# Patient Record
Sex: Male | Born: 2000 | Race: White | Hispanic: No | Marital: Single | Smoking: Current every day smoker
Health system: Southern US, Community
[De-identification: ages and names within clinical notes are randomized; demographics above are authoritative.]

---
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
## 2021-04-04 ENCOUNTER — Emergency Department (INDEPENDENT_AMBULATORY_CARE_PROVIDER_SITE_OTHER)
Admission: EM | Admit: 2021-04-04 | Discharge: 2021-04-04 | Disposition: A | Discharge status: Home / Self Care | Payer: No Typology Code available for payment source | Source: Home / Self Care | Attending: Family Medicine | Admitting: Family Medicine

## 2021-04-04 ENCOUNTER — Other Ambulatory Visit: Payer: Self-pay

## 2021-04-04 ENCOUNTER — Encounter: Payer: Self-pay | Admitting: Emergency Medicine

## 2021-04-04 ENCOUNTER — Emergency Department (INDEPENDENT_AMBULATORY_CARE_PROVIDER_SITE_OTHER): Payer: No Typology Code available for payment source

## 2021-04-04 DIAGNOSIS — S62347A Nondisplaced fracture of base of fifth metacarpal bone. left hand, initial encounter for closed fracture: Secondary | ICD-10-CM | POA: Diagnosis not present

## 2021-04-04 DIAGNOSIS — M25442 Effusion, left hand: Secondary | ICD-10-CM | POA: Diagnosis not present

## 2021-04-04 DIAGNOSIS — M79642 Pain in left hand: Secondary | ICD-10-CM

## 2021-04-04 NOTE — Discharge Instructions (Addendum)
Wear ace wrap to minimize swelling.  Elevate hand has much as possible.  May take ibuprofen or Tylenol as needed.

## 2021-04-04 NOTE — ED Triage Notes (Signed)
Left hand injury hit with a baseball last night, Painful, swollen.

## 2021-04-06 NOTE — ED Provider Notes (Signed)
[facility]    CSN: [id number] Arrival date & time: 04/04/21  0807      History   Chief Complaint Chief Complaint  Patient presents with  . Hand Injury    HPI Thomas Baxter is a 20 y.o. male.   Patient's left hand was hit on its ulnar aspect with a baseball last night.  He had immediate pain/swelling that has persisted.  The history is provided by the patient.  Hand Injury Location:  Hand Hand location:  L hand Injury: yes   Time since incident:  1 day Mechanism of injury comment:  Struck by baseball Pain details:    Quality:  Aching   Radiates to:  Does not radiate   Severity:  Moderate   Onset quality:  Sudden   Duration:  1 day   Timing:  Constant   Progression:  Unchanged Handedness:  Left-handed Prior injury to area:  No Relieved by:  Nothing Worsened by:  Movement Ineffective treatments:  Ice Associated symptoms: decreased range of motion, stiffness and swelling   Associated symptoms: no muscle weakness, no numbness and no tingling     History reviewed. No pertinent past medical history.  There are no problems to display for this patient.   History reviewed. No pertinent surgical history.     Home Medications    Prior to Admission medications   Medication Sig Start Date End Date Taking? Authorizing Provider  ibuprofen (ADVIL) 200 MG tablet Take 200 mg by mouth every 6 (six) hours as needed.   Yes [provider]    Family History Family History  Problem Relation Age of Onset  . Healthy Mother   . Healthy Father     Social History Social History   Tobacco Use  . Smoking status: Never Smoker  . Smokeless tobacco: Never Used  Vaping Use  . Vaping Use: Never used  Substance Use Topics  . Alcohol use: No     Allergies   Patient has no known allergies.   Review of Systems Review of Systems  Musculoskeletal: Positive for joint swelling and stiffness.  Skin: Negative for color change and wound.  All other  systems reviewed and are negative.    Physical Exam Triage Vital Signs ED Triage Vitals  Enc Vitals Group     BP 04/04/21 0834 128/72     Pulse Rate 04/04/21 0834 74     Resp 04/04/21 0834 18     Temp 04/04/21 0834 98.7 F (37.1 C)     Temp Source 04/04/21 0834 Oral     SpO2 04/04/21 0834 99 %     Weight 04/04/21 0835 150 lb (68 kg)     Height 04/04/21 0835 5\' 10"  (1.778 m)     Head Circumference --      Peak Flow --      Pain Score 04/04/21 0835 8     Pain Loc --      Pain Edu? --      Excl. in GC? --    No data found.  Updated Vital Signs BP 128/72 (BP Location: Right Arm)   Pulse 74   Temp 98.7 F (37.1 C) (Oral)   Resp 18   Ht 5\' 10"  (1.778 m)   Wt 68 kg   SpO2 99%   BMI 21.52 kg/m   Visual Acuity Right Eye Distance:   Left Eye Distance:   Bilateral Distance:    Right Eye Near:   Left Eye Near:  Bilateral Near:     Physical Exam Vitals and nursing note reviewed.  Constitutional:      General: He is not in acute distress. HENT:     Head: Atraumatic.  Cardiovascular:     Rate and Rhythm: Normal rate.  Pulmonary:     Effort: Pulmonary effort is normal.  Musculoskeletal:       Hands:     Comments: Patient's left hand has swelling and tenderness to palpation over the fifth MCP joint.  Fifth finger has decreased range of motion; distal neurovascular function is intact.   Skin:    General: Skin is warm and dry.  Neurological:     Mental Status: He is alert.      UC Treatments / Results  Labs (all labs ordered are listed, but only abnormal results are displayed) Labs Reviewed - No data to display  EKG   Radiology No results found.  Procedures Procedures (including critical care time)  Medications Ordered in UC Medications - No data to display  Initial Impression / Assessment and Plan / UC Course  I have reviewed the triage vital signs and the nursing notes.  Pertinent labs & imaging results that were available during my care of  the patient were reviewed by me and considered in my medical decision making (see chart for details).    Patient's fracture will require orthopedic evaluation/treatment.  He declines referral to orthopedist. Ace wrap applied to left hand. Recommend he follow-up with orthopedist as soon as possible.   Final Clinical Impressions(s) / UC Diagnoses   Final diagnoses:  Closed nondisplaced fracture of base of fifth metacarpal bone of left hand, initial encounter     Discharge Instructions     Wear ace wrap to minimize swelling.  Elevate hand has much as possible.  May take ibuprofen or Tylenol as needed.   ED Prescriptions    None        Lattie Haw, MD 04/06/21 1300

## 2021-08-26 IMAGING — DX DG FOOT COMPLETE 3+V*R*
3 series · 3 of 3 positions shown · non-contrast
Comparison: None.

CLINICAL DATA: Pain and swelling following rolling type injury

EXAM:
RIGHT FOOT COMPLETE - 3+ VIEW

[foot ap]
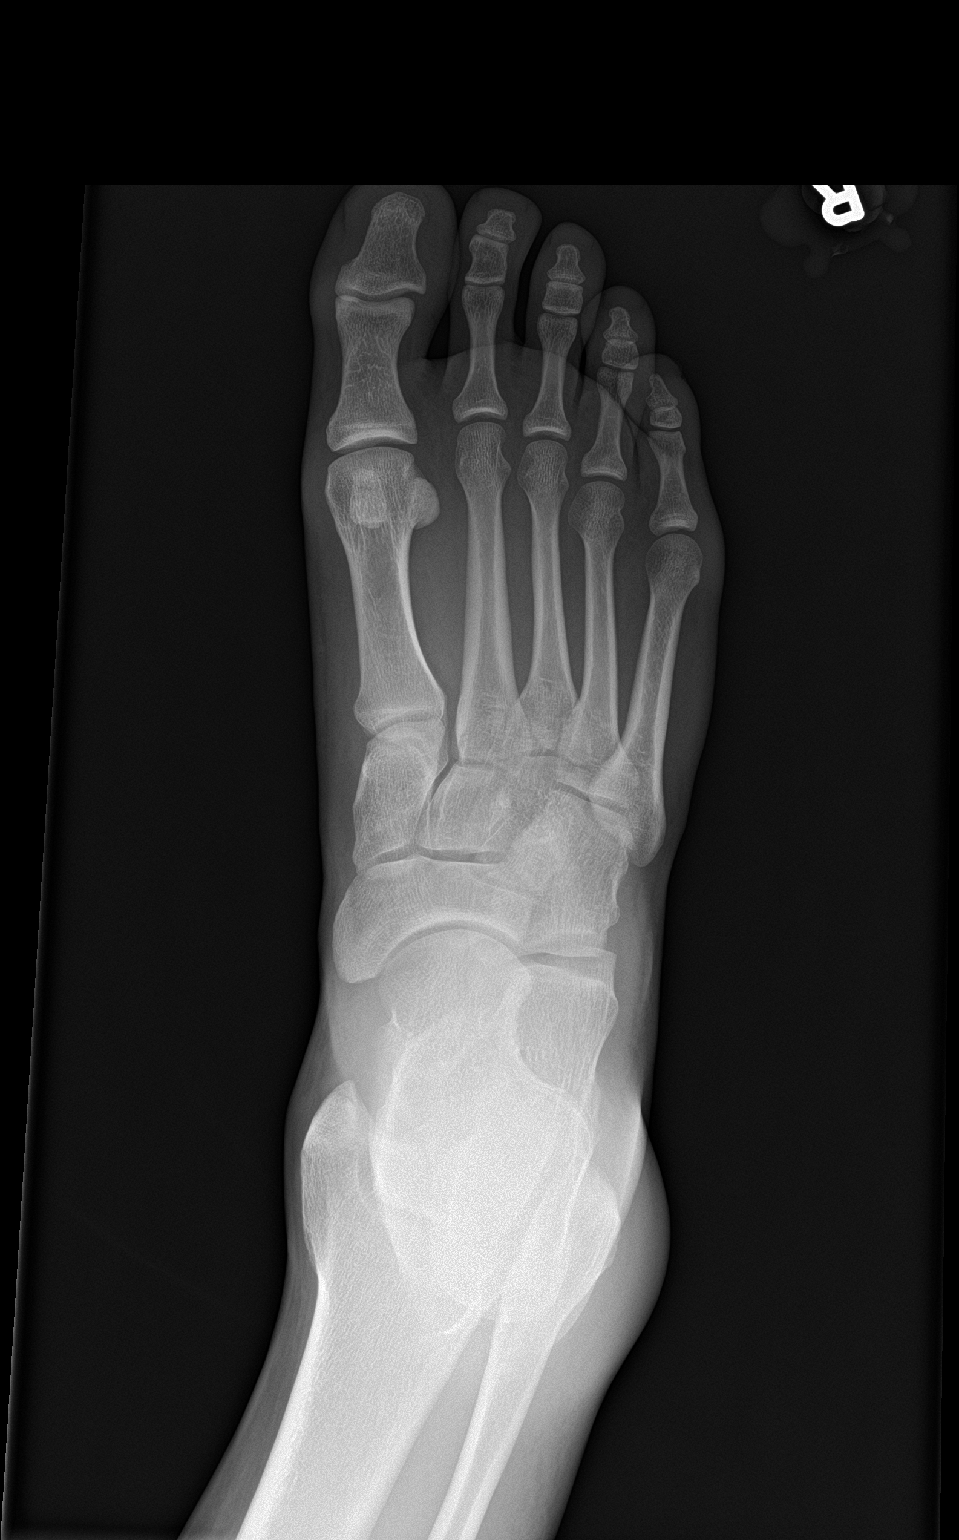

[foot obl]
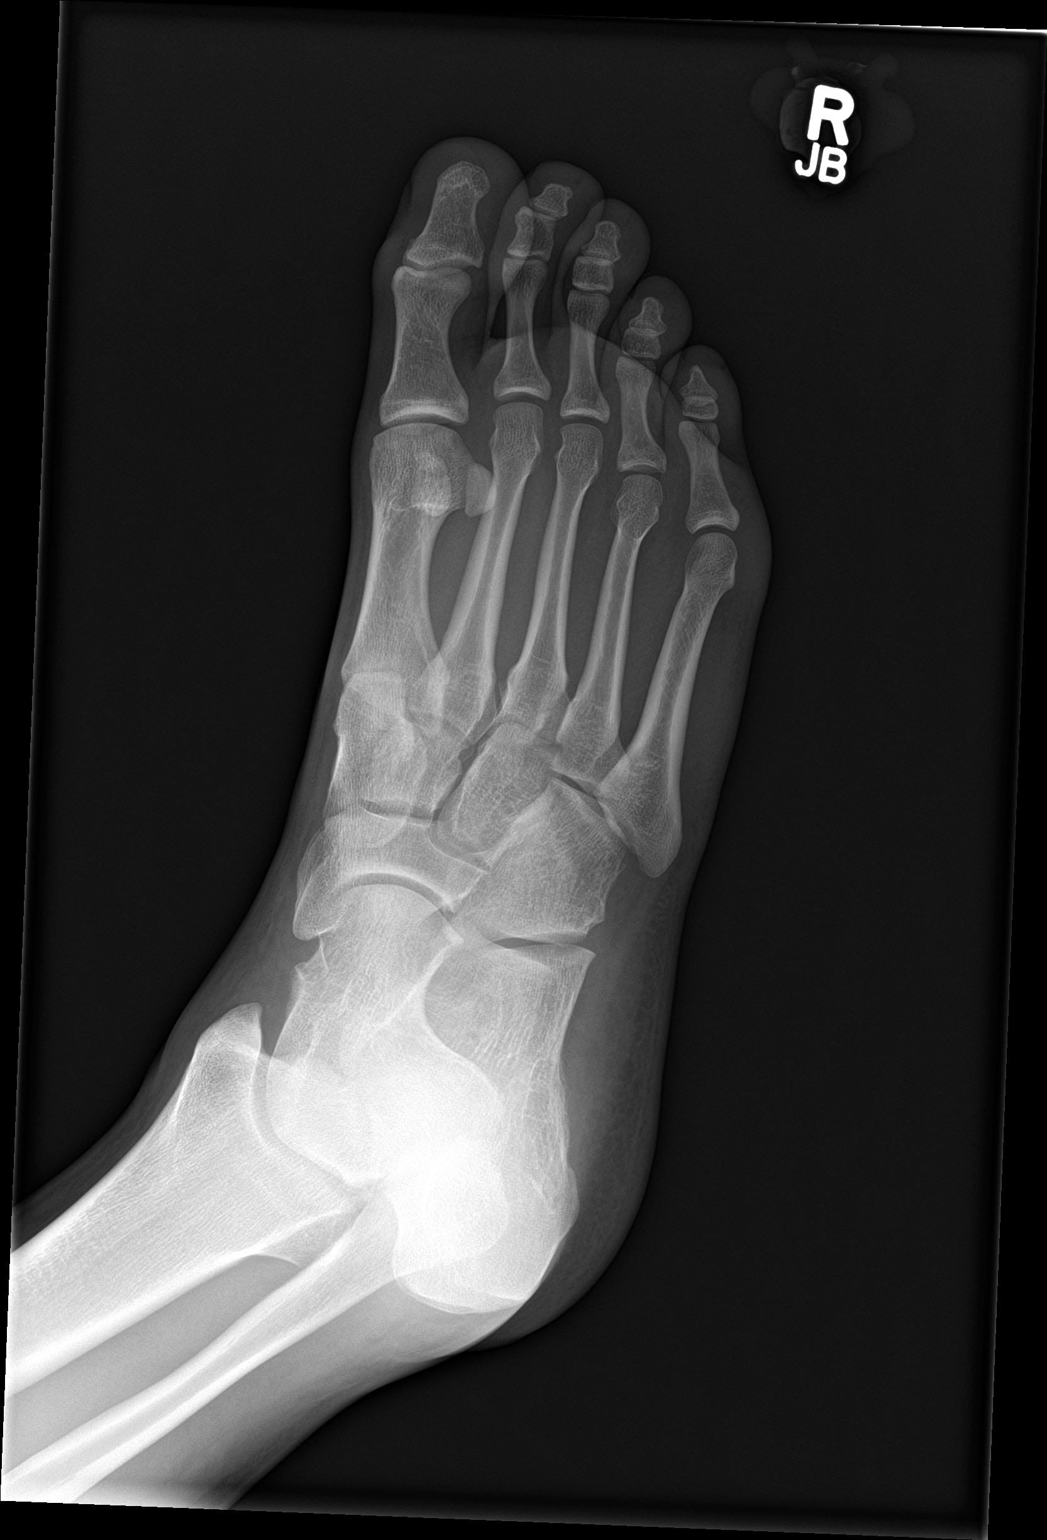

[foot lat]
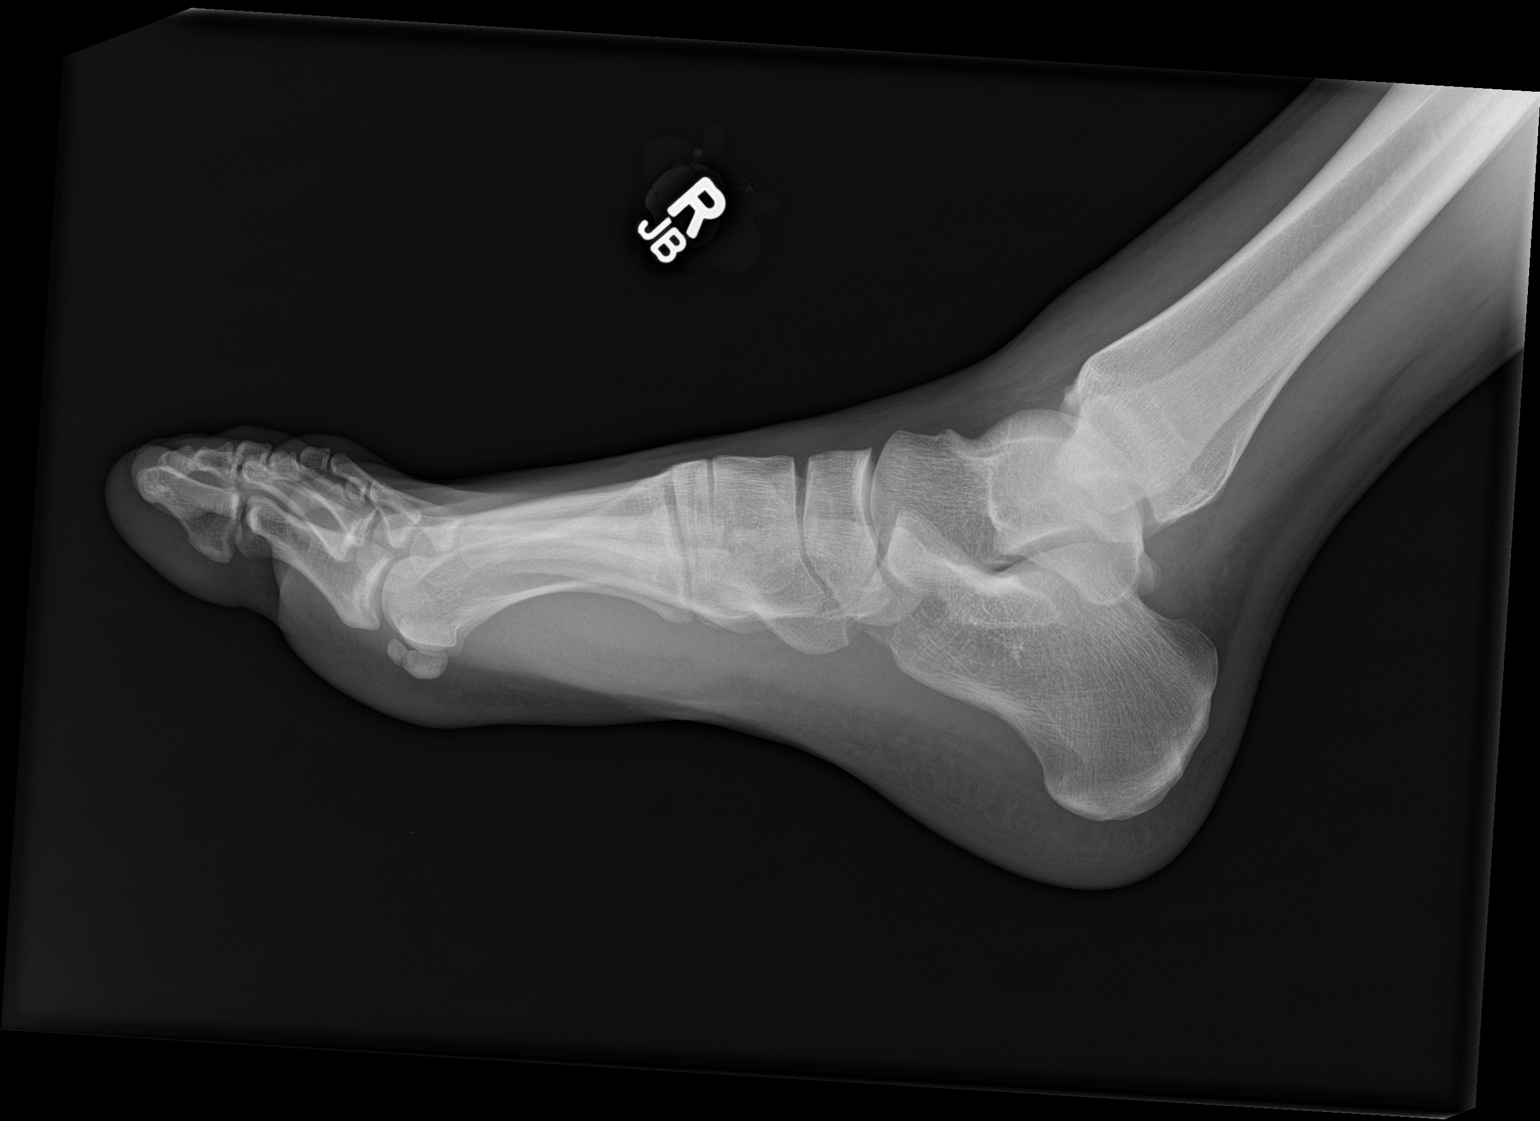

[3 of 3 positions shown; findings below may reference images not displayed]

FINDINGS: Frontal, oblique, and lateral views were obtained. There is no
fracture or dislocation. Joint spaces appear normal. No erosive
change.
IMPRESSION: No fracture or dislocation.  No evident arthropathy.

## 2023-08-24 ENCOUNTER — Encounter: Payer: Self-pay | Admitting: Emergency Medicine

## 2023-08-24 ENCOUNTER — Ambulatory Visit
Admission: EM | Admit: 2023-08-24 | Discharge: 2023-08-24 | Disposition: A | Discharge status: Home / Self Care | Payer: No Typology Code available for payment source | Attending: Physician Assistant | Admitting: Physician Assistant

## 2023-08-24 DIAGNOSIS — S46212A Strain of muscle, fascia and tendon of other parts of biceps, left arm, initial encounter: Secondary | ICD-10-CM | POA: Diagnosis not present

## 2023-08-24 MED ORDER — TRAMADOL HCL 50 MG PO TABS: 50.0000 mg | ORAL_TABLET | Freq: Three times a day (TID) | ORAL | 0 refills | Status: AC | PRN

## 2023-08-24 NOTE — Discharge Instructions (Signed)
SPRAIN: Stressed avoiding painful activities . Reviewed RICE guidelines. Use medications as directed, including NSAIDs. If no NSAIDs have been prescribed for you today, you may take Aleve or Motrin over the counter. May use Tylenol in between doses of NSAIDs.  If no improvement in the next 1-2 weeks, f/u with PCP or ortho.   TRY TO AVOID LIFTING.  You have a condition requiring you to follow up with Orthopedics so please call one of the following office for appointment:   [facility] Address: [address] [place], [place] [address] Phone: [phone]  [facility] [address] [place], [place] [address] Phone: [phone]  [facility] [address], [place], [place] [address] Phone: [phone]

## 2023-08-24 NOTE — ED Triage Notes (Signed)
Pt presents with left arm pain that started today. Pt states he lifted something at work.

## 2023-08-24 NOTE — ED Provider Notes (Signed)
[facility]    CSN: [id number] Arrival date & time: 08/24/23  1951      History   Chief Complaint Chief Complaint  Patient presents with   Arm Pain    HPI Hamlet Azoulay is a 22 y.o. right hand dominant male presenting for pain of the left arm x 6 hours.  He reports that pain started after he lifted something at work today.  He rates the pain at 6 out of 10. Says he felt something "pop in my arm." Denies any trauma or falls.  No contusions, deformity, swelling, wounds.  Has a 800 mg ibuprofen a couple of hours ago without relief.  HPI  History reviewed. No pertinent past medical history.  There are no problems to display for this patient.   History reviewed. No pertinent surgical history.     Home Medications    Prior to Admission medications   Medication Sig Start Date End Date Taking? Authorizing Provider  traMADol (ULTRAM) 50 MG tablet Take 1 tablet (50 mg total) by mouth every 8 (eight) hours as needed for up to 3 days for severe pain or moderate pain. 08/24/23 08/27/23 Yes Eusebio Friendly B, PA-C  ibuprofen (ADVIL) 200 MG tablet Take 200 mg by mouth every 6 (six) hours as needed.    [provider]    Family History Family History  Problem Relation Age of Onset   Healthy Mother    Healthy Father     Social History Social History   Tobacco Use   Smoking status: Every Day    Types: Cigarettes   Smokeless tobacco: Never  Vaping Use   Vaping status: Every Day  Substance Use Topics   Alcohol use: Yes     Allergies   Patient has no known allergies.   Review of Systems Review of Systems  Musculoskeletal:  Positive for arthralgias. Negative for joint swelling.     Physical Exam Triage Vital Signs ED Triage Vitals  Encounter Vitals Group     BP      Systolic BP Percentile      Diastolic BP Percentile      Pulse      Resp      Temp      Temp src      SpO2      Weight      Height      Head Circumference      Peak Flow       Pain Score      Pain Loc      Pain Education      Exclude from Growth Chart    No data found.  Updated Vital Signs BP (!) 147/69 (BP Location: Right Arm)   Pulse 62   Temp 98 F (36.7 C) (Oral)   SpO2 98%       Physical Exam Vitals and nursing note reviewed.  Constitutional:      General: He is not in acute distress.    Appearance: Normal appearance. He is well-developed. He is not ill-appearing.  HENT:     Head: Normocephalic and atraumatic.  Eyes:     General: No scleral icterus.    Conjunctiva/sclera: Conjunctivae normal.  Cardiovascular:     Rate and Rhythm: Normal rate.     Pulses: Normal pulses.  Pulmonary:     Effort: Pulmonary effort is normal. No respiratory distress.  Musculoskeletal:     Cervical back: Neck supple.     Comments: Left arm: No swelling,  contusions, abrasions/wounds or deformities noted.  There is tenderness of the biceps tendons at the antecubital region but tendons are palpable.  Full range of motion of joints of left arm.  Full strength.  Skin:    General: Skin is warm and dry.     Capillary Refill: Capillary refill takes less than 2 seconds.  Neurological:     General: No focal deficit present.     Mental Status: He is alert. Mental status is at baseline.     Motor: No weakness.     Gait: Gait normal.  Psychiatric:        Mood and Affect: Mood normal.        Behavior: Behavior normal.      UC Treatments / Results  Labs (all labs ordered are listed, but only abnormal results are displayed) Labs Reviewed - No data to display  EKG   Radiology No results found.  Procedures Procedures (including critical care time)  Medications Ordered in UC Medications - No data to display  Initial Impression / Assessment and Plan / UC Course  I have reviewed the triage vital signs and the nursing notes.  Pertinent labs & imaging results that were available during my care of the patient were reviewed by me and considered in my medical  decision making (see chart for details).   22 year old male presents for left biceps pain for the past 2 hours after lifting something heavy.  He says it folic something popped in his arm.  He has taken 800 mg ibuprofen without relief.  On exam he has no deformity and full range of motion.  He has tenderness of the biceps tendons at the antecubital region but they are palpable.  Suspect strain of biceps.  Low suspicion for tendon tear unless it is partial.  Supportive care and RICE guidelines discussed at this time. Sent short supply of Ultram as needed for severe pain.  Discussed Ortho follow-up.   Final Clinical Impressions(s) / UC Diagnoses   Final diagnoses:  Strain of left biceps muscle, initial encounter     Discharge Instructions      SPRAIN: Stressed avoiding painful activities . Reviewed RICE guidelines. Use medications as directed, including NSAIDs. If no NSAIDs have been prescribed for you today, you may take Aleve or Motrin over the counter. May use Tylenol in between doses of NSAIDs.  If no improvement in the next 1-2 weeks, f/u with PCP or ortho.   TRY TO AVOID LIFTING.  You have a condition requiring you to follow up with Orthopedics so please call one of the following office for appointment:   [facility] Address: [address] [place], [place] [address] Phone: [phone]  [facility] [address], [place], [place] [address] Phone: [phone]  [facility] [address], [place], [place] [address] Phone: [phone]      ED Prescriptions     Medication Sig Dispense Auth. Provider   traMADol (ULTRAM) 50 MG tablet Take 1 tablet (50 mg total) by mouth every 8 (eight) hours as needed for up to 3 days for severe pain or moderate pain. 9 tablet Shirlee Latch, PA-C      I have reviewed the PDMP during this encounter.   Shirlee Latch, PA-C 08/27/23 [phone]
